# Patient Record
Sex: Female | Born: 1985 | Race: White | Hispanic: No | Marital: Married
Health system: Southern US, Community
[De-identification: ages and names within clinical notes are randomized; demographics above are authoritative.]

---
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
## 2011-06-25 ENCOUNTER — Encounter: Payer: Self-pay | Admitting: Obstetrics and Gynecology

## 2011-06-29 ENCOUNTER — Encounter: Payer: Self-pay | Admitting: Obstetrics and Gynecology

## 2011-07-27 ENCOUNTER — Encounter: Payer: Self-pay | Admitting: Obstetrics and Gynecology

## 2011-08-27 ENCOUNTER — Encounter: Payer: Self-pay | Admitting: Obstetrics and Gynecology

## 2011-09-14 ENCOUNTER — Inpatient Hospital Stay: Payer: Self-pay | Admitting: Student

## 2011-09-14 LAB — COMPREHENSIVE METABOLIC PANEL
Albumin: 3.5 g/dL (ref 3.4–5.0)
Alkaline Phosphatase: 387 U/L — ABNORMAL HIGH (ref 50–136)
BUN: 11 mg/dL (ref 7–18)
Chloride: 100 mmol/L (ref 98–107)
Co2: 26 mmol/L (ref 21–32)
Creatinine: 0.73 mg/dL (ref 0.60–1.30)
EGFR (African American): >60
EGFR (Non-African Amer.): >60
Potassium: 3.8 mmol/L (ref 3.5–5.1)
Sodium: 136 mmol/L (ref 136–145)

## 2011-09-14 LAB — CBC
HCT: 39.9 % (ref 35.0–47.0)
MCH: 29.4 pg (ref 26.0–34.0)
MCHC: 33.2 g/dL (ref 32.0–36.0)
MCV: 89 fL (ref 80–100)
Platelet: 257 10*3/uL (ref 150–440)
RBC: 4.51 10*6/uL (ref 3.80–5.20)
RDW: 12 % (ref 11.5–14.5)
WBC: 6.2 10*3/uL (ref 3.6–11.0)

## 2011-09-14 LAB — URINALYSIS, COMPLETE
Bacteria: NONE SEEN
Bilirubin,UR: NEGATIVE
Blood: NEGATIVE
Glucose,UR: NEGATIVE mg/dL (ref 0–75)
Nitrite: NEGATIVE
Ph: 7 (ref 4.5–8.0)
Protein: NEGATIVE
RBC,UR: <1 /HPF (ref 0–5)
Specific Gravity: 1.012 (ref 1.003–1.030)
Squamous Epithelial: 3
WBC UR: 1 /HPF (ref 0–5)

## 2011-09-14 LAB — LIPASE, BLOOD: Lipase: 75 U/L (ref 73–393)

## 2011-09-14 LAB — PREGNANCY, URINE: Pregnancy Test, Urine: NEGATIVE m[IU]/mL

## 2011-09-15 LAB — CBC WITH DIFFERENTIAL/PLATELET
Basophil #: 0 10*3/uL (ref 0.0–0.1)
Basophil %: 0.2 %
Eosinophil #: 0 10*3/uL (ref 0.0–0.7)
Eosinophil %: 0.2 %
HGB: 11.5 g/dL — ABNORMAL LOW (ref 12.0–16.0)
Lymphocyte #: 0.9 10*3/uL — ABNORMAL LOW (ref 1.0–3.6)
Lymphocyte %: 9.8 %
MCV: 89 fL (ref 80–100)
Neutrophil #: 7.8 10*3/uL — ABNORMAL HIGH (ref 1.4–6.5)
Neutrophil %: 84.2 %
Platelet: 274 10*3/uL (ref 150–440)
RBC: 3.87 10*6/uL (ref 3.80–5.20)
RDW: 12.4 % (ref 11.5–14.5)

## 2011-09-15 LAB — COMPREHENSIVE METABOLIC PANEL
Albumin: 3.1 g/dL — ABNORMAL LOW (ref 3.4–5.0)
Alkaline Phosphatase: 323 U/L — ABNORMAL HIGH (ref 50–136)
Anion Gap: 10 (ref 7–16)
BUN: 9 mg/dL (ref 7–18)
Calcium, Total: 8.7 mg/dL (ref 8.5–10.1)
Co2: 24 mmol/L (ref 21–32)
Creatinine: 0.61 mg/dL (ref 0.60–1.30)
EGFR (African American): >60
EGFR (Non-African Amer.): >60
Osmolality: 274 (ref 275–301)
Potassium: 4.1 mmol/L (ref 3.5–5.1)
SGOT(AST): 33 U/L (ref 15–37)
Sodium: 138 mmol/L (ref 136–145)
Total Protein: 7.1 g/dL (ref 6.4–8.2)

## 2011-09-16 LAB — CBC WITH DIFFERENTIAL/PLATELET
Basophil #: 0 10*3/uL (ref 0.0–0.1)
Basophil %: 0.4 %
Eosinophil #: 0.3 10*3/uL (ref 0.0–0.7)
HGB: 10 g/dL — ABNORMAL LOW (ref 12.0–16.0)
Lymphocyte %: 17.4 %
MCHC: 33 g/dL (ref 32.0–36.0)
MCV: 89 fL (ref 80–100)
Monocyte #: 1 x10 3/mm — ABNORMAL HIGH (ref 0.2–0.9)
Neutrophil #: 5.8 10*3/uL (ref 1.4–6.5)
Neutrophil %: 67.2 %
Platelet: 238 10*3/uL (ref 150–440)
RBC: 3.43 10*6/uL — ABNORMAL LOW (ref 3.80–5.20)
WBC: 8.6 10*3/uL (ref 3.6–11.0)

## 2011-09-16 LAB — HEPATIC FUNCTION PANEL A (ARMC)
Albumin: 2.4 g/dL — ABNORMAL LOW (ref 3.4–5.0)
Alkaline Phosphatase: 218 U/L — ABNORMAL HIGH (ref 50–136)
Bilirubin, Direct: 0.9 mg/dL — ABNORMAL HIGH (ref 0.00–0.20)
Bilirubin,Total: 1.4 mg/dL — ABNORMAL HIGH (ref 0.2–1.0)
SGOT(AST): 23 U/L (ref 15–37)
SGPT (ALT): 79 U/L — ABNORMAL HIGH
Total Protein: 5.8 g/dL — ABNORMAL LOW (ref 6.4–8.2)

## 2011-09-16 LAB — LIPASE, BLOOD: Lipase: 107 U/L (ref 73–393)

## 2011-10-02 ENCOUNTER — Emergency Department: Payer: Self-pay | Admitting: Emergency Medicine

## 2011-10-02 LAB — URINALYSIS, COMPLETE
Bilirubin,UR: NEGATIVE
Blood: NEGATIVE
Glucose,UR: NEGATIVE mg/dL (ref 0–75)
Ketone: NEGATIVE
Nitrite: NEGATIVE
Ph: 6 (ref 4.5–8.0)
Protein: NEGATIVE
RBC,UR: 1 /HPF (ref 0–5)
Specific Gravity: 1.004 (ref 1.003–1.030)
Squamous Epithelial: 3
WBC UR: 1 /HPF (ref 0–5)

## 2011-10-02 LAB — COMPREHENSIVE METABOLIC PANEL
Albumin: 4 g/dL (ref 3.4–5.0)
Alkaline Phosphatase: 128 U/L (ref 50–136)
BUN: 7 mg/dL (ref 7–18)
Bilirubin,Total: 0.8 mg/dL (ref 0.2–1.0)
Calcium, Total: 9 mg/dL (ref 8.5–10.1)
Co2: 27 mmol/L (ref 21–32)
Creatinine: 0.6 mg/dL (ref 0.60–1.30)
Osmolality: 277 (ref 275–301)
Potassium: 3.7 mmol/L (ref 3.5–5.1)
SGPT (ALT): 21 U/L
Sodium: 140 mmol/L (ref 136–145)

## 2011-10-02 LAB — CBC
HGB: 11.7 g/dL — ABNORMAL LOW (ref 12.0–16.0)
MCH: 29.7 pg (ref 26.0–34.0)
MCV: 88 fL (ref 80–100)
Platelet: 266 10*3/uL (ref 150–440)
RBC: 3.93 10*6/uL (ref 3.80–5.20)
RDW: 12.5 % (ref 11.5–14.5)

## 2011-10-02 LAB — AMYLASE: Amylase: 50 U/L (ref 25–115)

## 2011-10-03 ENCOUNTER — Inpatient Hospital Stay: Payer: Self-pay | Admitting: Specialist

## 2011-10-03 LAB — COMPREHENSIVE METABOLIC PANEL
Albumin: 3.9 g/dL (ref 3.4–5.0)
Alkaline Phosphatase: 129 U/L (ref 50–136)
Anion Gap: 6 — ABNORMAL LOW (ref 7–16)
BUN: 6 mg/dL — ABNORMAL LOW (ref 7–18)
Calcium, Total: 8.4 mg/dL — ABNORMAL LOW (ref 8.5–10.1)
Chloride: 106 mmol/L (ref 98–107)
Co2: 26 mmol/L (ref 21–32)
EGFR (African American): >60
Glucose: 86 mg/dL (ref 65–99)
Potassium: 3.7 mmol/L (ref 3.5–5.1)
SGOT(AST): 18 U/L (ref 15–37)
SGPT (ALT): 23 U/L
Sodium: 138 mmol/L (ref 136–145)

## 2011-10-03 LAB — CBC
MCH: 29.8 pg (ref 26.0–34.0)
MCV: 88 fL (ref 80–100)
Platelet: 259 10*3/uL (ref 150–440)
RBC: 3.96 10*6/uL (ref 3.80–5.20)
WBC: 5.1 10*3/uL (ref 3.6–11.0)

## 2011-10-03 LAB — LIPASE, BLOOD: Lipase: 83 U/L (ref 73–393)

## 2011-10-04 LAB — BASIC METABOLIC PANEL
Anion Gap: 6 — ABNORMAL LOW (ref 7–16)
BUN: 3 mg/dL — ABNORMAL LOW (ref 7–18)
Calcium, Total: 7.9 mg/dL — ABNORMAL LOW (ref 8.5–10.1)
Co2: 29 mmol/L (ref 21–32)
EGFR (African American): >60
EGFR (Non-African Amer.): >60
Osmolality: 278 (ref 275–301)

## 2011-10-04 LAB — HEPATIC FUNCTION PANEL A (ARMC)
Albumin: 3.2 g/dL — ABNORMAL LOW (ref 3.4–5.0)
Bilirubin, Direct: 0.3 mg/dL — ABNORMAL HIGH (ref 0.00–0.20)
Bilirubin,Total: 0.7 mg/dL (ref 0.2–1.0)
SGOT(AST): 19 U/L (ref 15–37)

## 2011-10-04 LAB — CBC WITH DIFFERENTIAL/PLATELET
Basophil #: 0 10*3/uL (ref 0.0–0.1)
Eosinophil %: 8.8 %
HCT: 32.9 % — ABNORMAL LOW (ref 35.0–47.0)
Lymphocyte %: 35.3 %
MCH: 29.8 pg (ref 26.0–34.0)
MCHC: 33.7 g/dL (ref 32.0–36.0)
Monocyte #: 0.4 x10 3/mm (ref 0.2–0.9)
Monocyte %: 9.2 %
Neutrophil %: 46.2 %
RBC: 3.72 10*6/uL — ABNORMAL LOW (ref 3.80–5.20)
WBC: 4.7 10*3/uL (ref 3.6–11.0)

## 2011-10-06 LAB — COMPREHENSIVE METABOLIC PANEL
Albumin: 3.2 g/dL — ABNORMAL LOW (ref 3.4–5.0)
Anion Gap: 6 — ABNORMAL LOW (ref 7–16)
BUN: <1 mg/dL — ABNORMAL LOW (ref 7–18)
Bilirubin,Total: 0.7 mg/dL (ref 0.2–1.0)
Calcium, Total: 8.1 mg/dL — ABNORMAL LOW (ref 8.5–10.1)
Co2: 30 mmol/L (ref 21–32)
EGFR (Non-African Amer.): >60
Glucose: 94 mg/dL (ref 65–99)
Total Protein: 6.2 g/dL — ABNORMAL LOW (ref 6.4–8.2)

## 2011-10-06 LAB — CBC WITH DIFFERENTIAL/PLATELET
Basophil %: 0.3 %
Eosinophil #: 0.2 10*3/uL (ref 0.0–0.7)
HCT: 32.2 % — ABNORMAL LOW (ref 35.0–47.0)
HGB: 11 g/dL — ABNORMAL LOW (ref 12.0–16.0)
Lymphocyte #: 1 10*3/uL (ref 1.0–3.6)
Lymphocyte %: 22.5 %
MCHC: 34.2 g/dL (ref 32.0–36.0)
MCV: 88 fL (ref 80–100)
Monocyte %: 9.3 %
Neutrophil #: 2.8 10*3/uL (ref 1.4–6.5)
Neutrophil %: 64.2 %
RDW: 12.3 % (ref 11.5–14.5)
WBC: 4.4 10*3/uL (ref 3.6–11.0)

## 2011-10-20 ENCOUNTER — Ambulatory Visit: Payer: Self-pay | Admitting: Family Medicine

## 2011-10-20 LAB — COMPREHENSIVE METABOLIC PANEL
Albumin: 4.1 g/dL (ref 3.4–5.0)
Alkaline Phosphatase: 128 U/L (ref 50–136)
Anion Gap: 6 — ABNORMAL LOW (ref 7–16)
BUN: 10 mg/dL (ref 7–18)
Calcium, Total: 8.7 mg/dL (ref 8.5–10.1)
Chloride: 104 mmol/L (ref 98–107)
Co2: 32 mmol/L (ref 21–32)
Creatinine: 0.8 mg/dL (ref 0.60–1.30)
EGFR (African American): >60
Glucose: 72 mg/dL (ref 65–99)
Osmolality: 281 (ref 275–301)
Potassium: 3.4 mmol/L — ABNORMAL LOW (ref 3.5–5.1)
SGOT(AST): 36 U/L (ref 15–37)
SGPT (ALT): 39 U/L

## 2011-10-20 LAB — URINALYSIS, COMPLETE
Blood: NEGATIVE
Glucose,UR: NEGATIVE mg/dL (ref 0–75)
Ketone: NEGATIVE
Leukocyte Esterase: NEGATIVE
Nitrite: NEGATIVE
Ph: 6 (ref 4.5–8.0)
Specific Gravity: >=1.03 (ref 1.003–1.030)
WBC UR: NONE SEEN /HPF (ref 0–5)

## 2011-10-20 LAB — CBC WITH DIFFERENTIAL/PLATELET
Basophil #: 0 10*3/uL (ref 0.0–0.1)
Basophil %: 0.5 %
Eosinophil #: 0.2 10*3/uL (ref 0.0–0.7)
Eosinophil %: 3 %
HCT: 36.6 % (ref 35.0–47.0)
HGB: 12.2 g/dL (ref 12.0–16.0)
Lymphocyte #: 1.9 10*3/uL (ref 1.0–3.6)
Lymphocyte %: 32.3 %
Monocyte #: 0.2 x10 3/mm (ref 0.2–0.9)
Monocyte %: 3.3 %
Neutrophil #: 3.6 10*3/uL (ref 1.4–6.5)
Platelet: 237 10*3/uL (ref 150–440)
RBC: 4.15 10*6/uL (ref 3.80–5.20)
WBC: 5.8 10*3/uL (ref 3.6–11.0)

## 2011-10-20 LAB — PREGNANCY, URINE: Pregnancy Test, Urine: NEGATIVE m[IU]/mL

## 2011-10-21 LAB — URINE CULTURE

## 2012-12-23 IMAGING — US ABDOMEN ULTRASOUND
1 series · 17 of 25 positions shown · non-contrast
Comparison: none

REASON FOR EXAM: persistent abdominal pain, please also reassess fluid
collection in gallbladder
COMMENTS:

PROCEDURE:     US  - US ABDOMEN GENERAL SURVEY  - October 05, 2011 [DATE]
RESULT:     Comparison: 10/03/2011
TECHNIQUE: Multiple grayscale and color Doppler images were obtained of the
abdomen.

[Series 1: abdomen ultrasound · 17 of 90 slices shown]
[im 1/90]
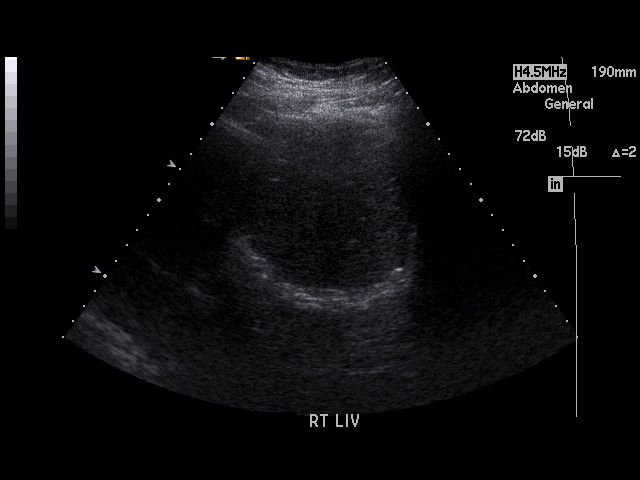
[im 8/90]
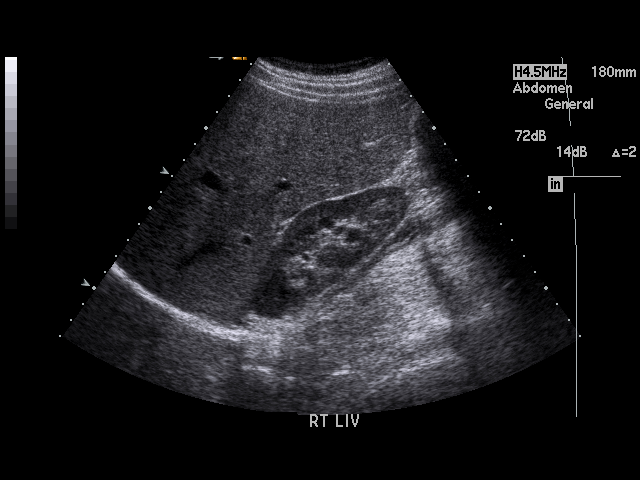
[im 12/90]
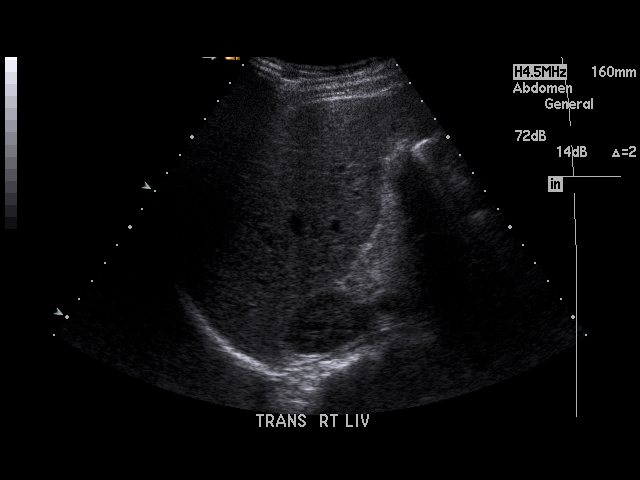
[im 19/90]
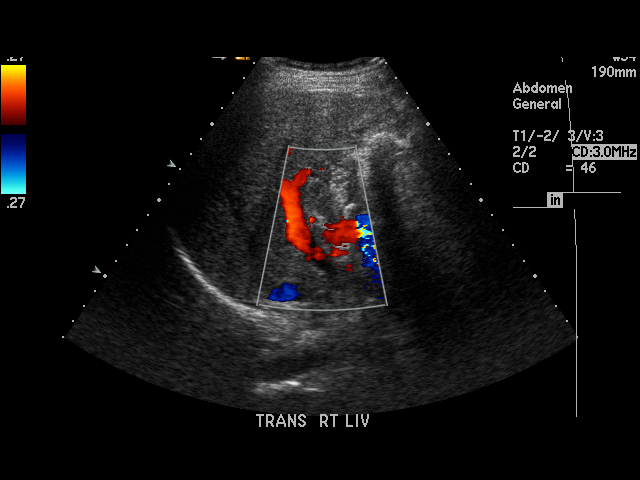
[im 23/90]
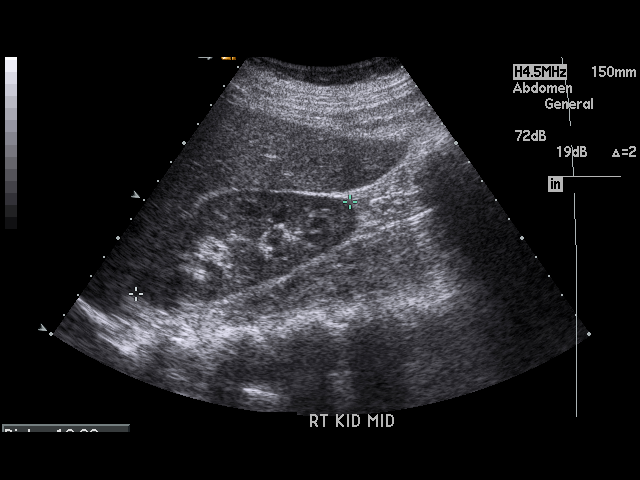
[im 30/90]
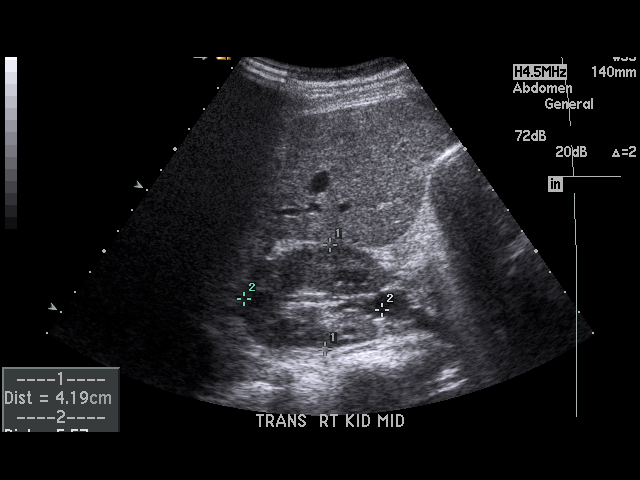
[im 34/90]
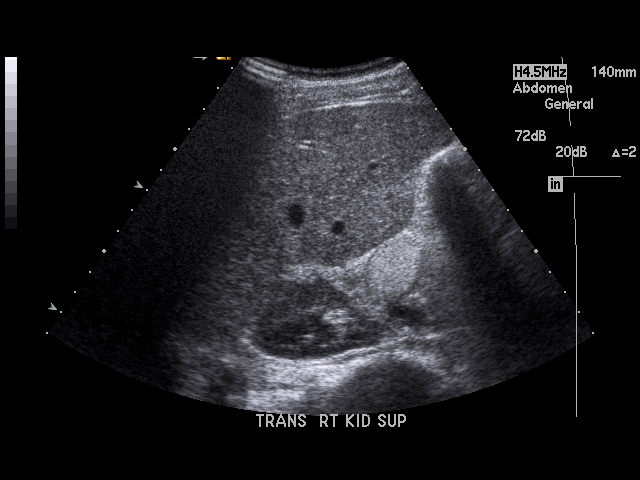
[im 41/90]
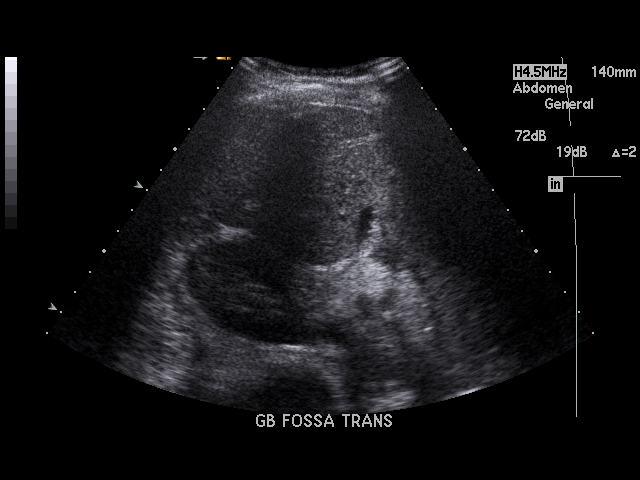
[im 45/90]
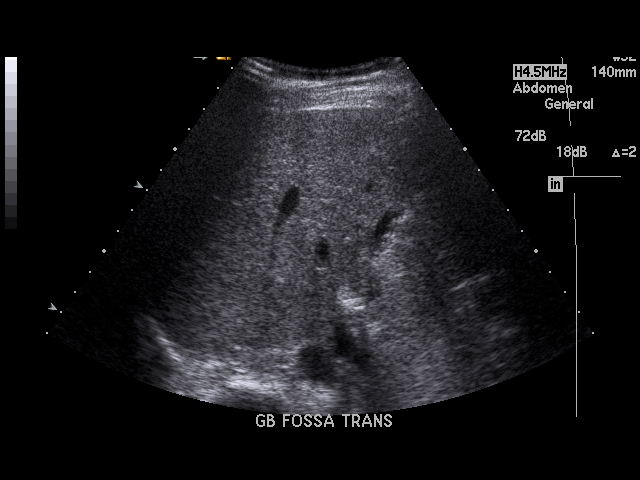
[im 49/90]
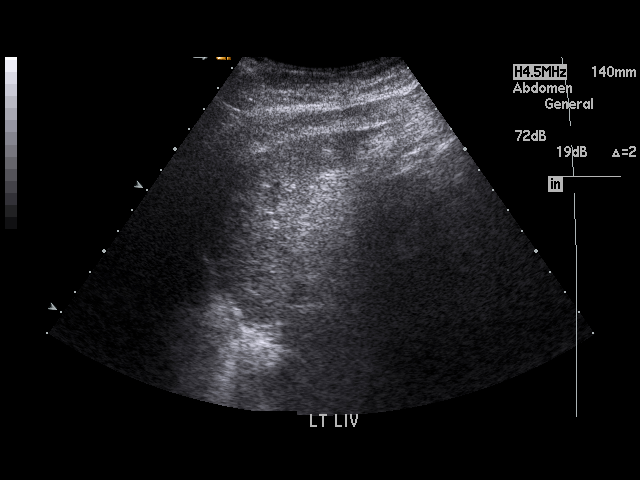
[im 56/90]
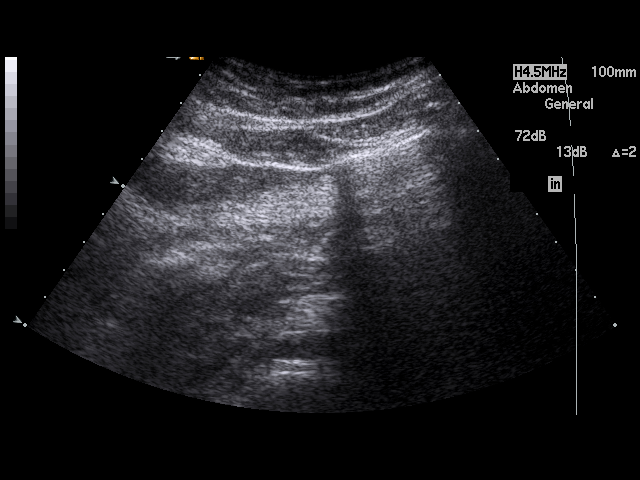
[im 60/90]
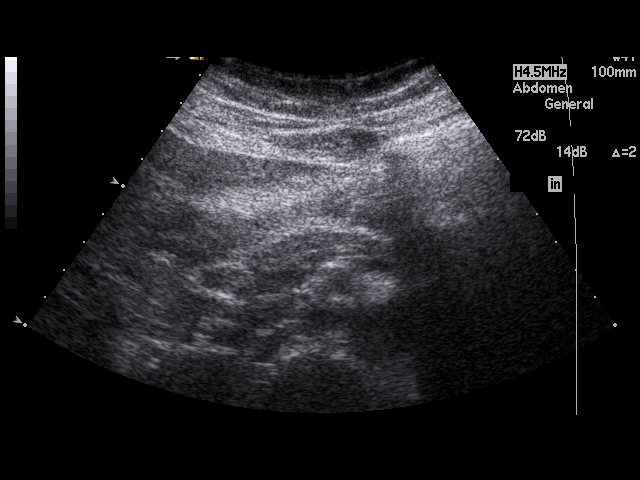
[im 67/90]
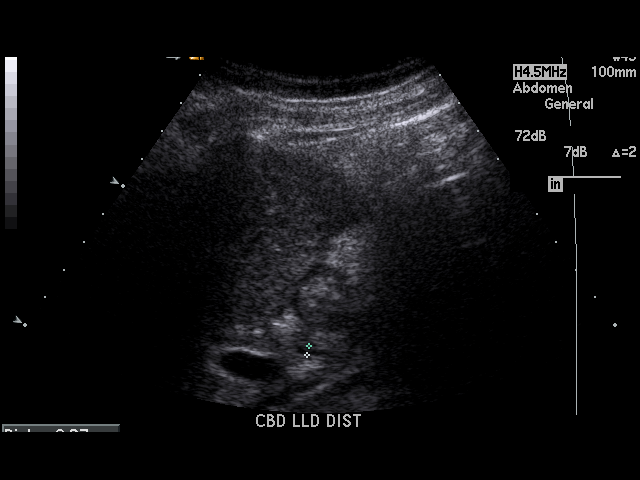
[im 71/90]
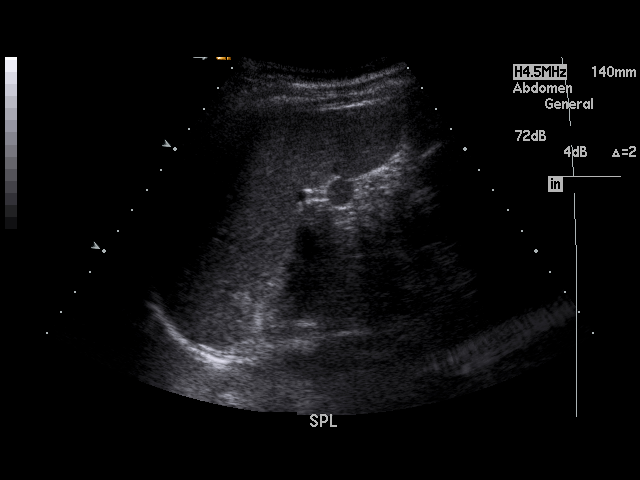
[im 78/90]
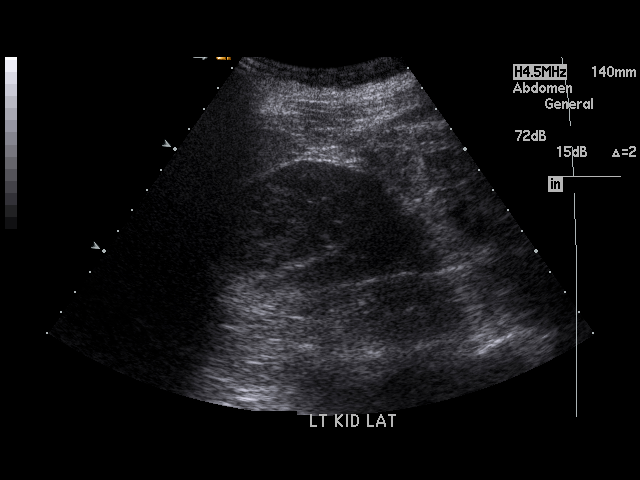
[im 82/90]
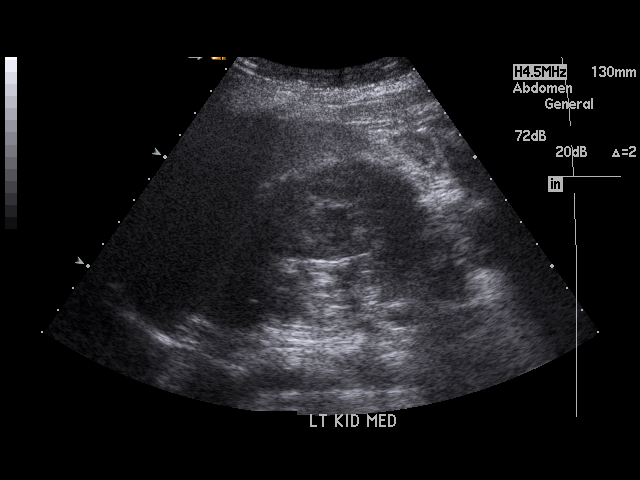
[im 90/90]
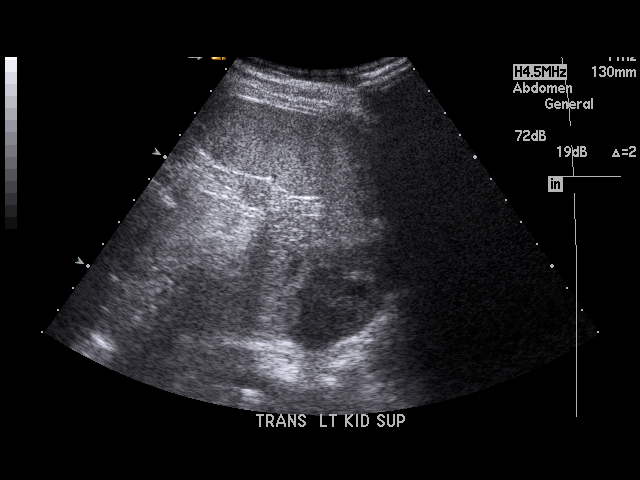

[17 of 25 positions shown; findings below may reference images not displayed]

FINDINGS: The visualized liver, pancreas, and spleen are unremarkable. There is a
small hypoechoic collection in the region of the gallbladder fossa which
measures 2.4 x 0.9 x 0.7 cm. Previously this measured 2.6 x 1.1 x 1.3 cm.
The patient is status post cholecystectomy. The common bile duct measures 3
mm. Small hypoechoic round focus adjacent to the spleen is consistent with a
splenule.

Images of the kidneys showed no hydronephrosis.
IMPRESSION: 1. Small hypoechoic collection in the gallbladder fossa is similar, to
slightly decreased in size from prior. This may be related to postoperative
changes. A biloma or infection are not excluded.
2. The common bile duct is normal in caliber.

## 2014-09-19 NOTE — Consult Note (Signed)
ERCP showed bile leak near cystic duct stump. Biliary sphincterotomy done. Balloon sweep revealed no stones. 7 Fr x 5 cm biliary stent placed. Clear liquid diet. Pain should improve quickly. Will need repeat ERCP in 1 month to make sure bile leak sealed and to remove stent. Dr. Mechele CollinElliott will check on patient tomorrow. Thanks.  Electronic Signatures: Lutricia Feilh, Kizzie Cotten (MD)  (Signed on 19-Apr-13 13:48)  Authored  Last Updated: 19-Apr-13 13:48 by Lutricia Feilh, Irving Bloor (MD)

## 2014-09-19 NOTE — Consult Note (Signed)
PATIENT NAMCourtney Thomas:  Thomas, Karla MR#:  [id number] DATE OF BIRTH:  03/22/1986  DATE OF CONSULTATION:  10/05/2011  REFERRING PHYSICIAN:   CONSULTING PHYSICIAN:  Adah Salvageichard E. Excell Seltzerooper, MD  CHIEF COMPLAINT: Right upper quadrant pain.   HISTORY OF PRESENT ILLNESS: This is a patient with a complicated history. She had a [facility] several weeks ago, had ongoing pain, stayed in the he hospital at that time for several days and was discharged. Came to [facility] and had findings suggestive of a biliary leak where an ERCP was performed and a stent was placed with papillotomy. Her pain resolved and then over the last few days her pain has increased again. She was admitted to the hospital for pain control and an ERCP performed yesterday by Dr. Bluford Kaufmannh showed no sign of a leak with complete healing and resolution of the leak and the stent was removed. Her liver function tests are normal. Her lipase is normal and an ultrasound shows a 2 cm fluid collection.   PAST MEDICAL HISTORY: Chronic pelvic inflammatory disease, unclear etiology. She is on multiple pain medications for this including Neurontin and apparently sees a pain specialist.   PAST SURGICAL HISTORY:  1. Left salpingectomy. 2. Cesarean section.  3. Laparoscopic cholecystectomy. 4. Two ERCPs.   ALLERGIES: None.   MEDICATIONS: Multiple, see chart.   FAMILY HISTORY: Noncontributory.   SOCIAL HISTORY: Patient was working a week ago prior to the onset of this recent pain.   REVIEW OF SYSTEMS: 10 system review was attempted but the patient is very somnolent and her boyfriend/significant other answers most of the questions. Otherwise negative with the exception of that mentioned in the history of present illness.   PHYSICAL EXAMINATION:  GENERAL: Somnolent-appearing patient, again her significant other answers most of the questions as she is very sleepy or does not care to answer the  questions.   VITAL SIGNS: She is afebrile, heart rate 104, respirations 18, blood pressure 117/86, 100% room air sat.   HEENT: No scleral icterus.   NECK: No palpable neck nodes.   ABDOMEN: Soft, nondistended. Scars are well healed. Her abdomen is essentially nontender. No rebound. No percussion tenderness. No guarding.   INTEGUMENT: No jaundice.   EXTREMITIES: Calves are nontender.   LABORATORY, DIAGNOSTIC AND RADIOLOGICAL DATA: Laboratory values are within normal limits with the exception of a mild anemia and normal white blood cell count. LFTs are normal.   An ultrasound shows a small fluid collection documented on two separate ultrasounds one day apart, however, the collection itself was very small.   ERCP results were reviewed as are the records.   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 There is no evidence of it being a open at this point. She had an ERCP yesterday that failed to identify a leak. The stent has been removed. She has a very small fluid collection which is too small to drain and I suspect that her pain is related to postoperative inflammatory process following biliary fistula which is now closed. My recommendation would be for either inpatient or outpatient pain control management and follow up with an ultrasound at some point by her primary care physician or our office as I see no surgical indications at this point. ____________________________ Adah Salvageichard E. Excell Seltzerooper, MD rec:cms D: 10/05/2011 20:39:28 ET T: 10/06/2011 14:51:37 ET  JOB#: [id number] Lattie HawICHARD E Mickelle Goupil MD ELECTRONICALLY SIGNED 10/06/2011 19:23

## 2014-09-19 NOTE — Consult Note (Signed)
Pt seen and examined. S/P choly for symptomatic gallstones. Admitted for recurrent abd pain and LFT elevation. T[facility]. [facility] due to frustration over lack of medical care. U/S show fluid at GGB fossa. DDx either retained CBD stone vs biloma. ERCP discussed in detail. Discussed potential risks, incl pancreatitis,etc.  If biloma, sphincterotomy with biliary stenting. If CBD stone, biliary sphincterotomy with stone extraction. Pt readily agreed. Plan this afternoon. Thanks  Electronic Signatures: Lutricia Feilh, Chrisangel Eskenazi (MD)  (Signed on 19-Apr-13 12:44)  Authored  Last Updated: 19-Apr-13 12:44 by Lutricia Feilh, Joella Saefong (MD)

## 2014-09-19 NOTE — Consult Note (Signed)
ERCP showed stent in place. This was removed. Cholangiogram showed complete healing of bile leak. No stone or sludge in CBD. No need for restenting. No DU or GU seen anywhere. Reg diet ordered. Hopefully, discharge soon. Will sign off. Thanks.  Electronic Signatures: Lutricia Feilh, Delonta Yohannes (MD)  (Signed on 09-May-13 12:35)  Authored  Last Updated: 09-May-13 12:35 by Lutricia Feilh, Tericka Devincenzi (MD)

## 2014-09-19 NOTE — Consult Note (Signed)
Chief Complaint:   Subjective/Chief Complaint Admitted for abdominal pain s/p cholecystecomy performed at [facility] on 08/07/2011 and diagnosed at the time presentation to ER 09/14/2011 with biliary stent. She had an ERCP performed by Dr. Verdie Shire yesterday for trhe indication of biliary leak.  Spinctectomy as well as CBD stent was placed.  Pain became severe yesterday evening. Dr. Vira Agar was notified and Morphine Sulfate PCA pump was ordered.  She states the PCA pump has helped in controlling the abdominal pain.  Complaint of nausea.  She had an order for Zofran 4 mg every 8 hours IV and just received Phenergan 25 mg IM.  No vomiting.  No bowel movement today but passing gas.  Patient was on MiraLax once daily prior to admission s/p surgery.   VITAL SIGNS/ANCILLARY NOTES: **Vital Signs.:   20-Apr-13 06:09   Vital Signs Type Routine   Temperature Temperature (F) 98.2   Celsius 36.7   Temperature Source oral   Pulse Pulse 92   Pulse source per Dinamap   Respirations Respirations 18   Systolic BP Systolic BP 440   Diastolic BP (mmHg) Diastolic BP (mmHg) 72   Mean BP 87   BP Source Dinamap   Pulse Ox % Pulse Ox % 99   Pulse Ox Activity Level  At rest   Oxygen Delivery Room Air/ 21 %   Brief Assessment:   Cardiac Regular  no murmur    Respiratory clear BS  no use of accessory muscles  Decreased respiratory effort due to pain.    Gastrointestinal details normal Slightly distended.  Generalized abdominal pain more localized proximal to umbilicus as well as epigastric.    Additional Physical Exam Alert and oriented. Appears in moderate discomfort.  Husband at bedside.  Color pink.  Skin warm and dry.     Routine Hem:  20-Apr-13 06:21    WBC (CBC) 9.3   RBC (CBC) 3.87   Hemoglobin (CBC) 11.5   Hematocrit (CBC) 34.2   Platelet Count (CBC) 274   MCV 89   MCH 29.8   MCHC 33.6   RDW 12.4   Neutrophil % 84.2   Lymphocyte % 9.8   Monocyte % 5.6   Eosinophil % 0.2   Basophil % 0.2    Neutrophil # 7.8   Lymphocyte # 0.9   Monocyte # 0.5   Eosinophil # 0.0   Basophil # 0.0  Routine Chem:  20-Apr-13 06:21    Glucose, Serum 88   BUN 9   Creatinine (comp) 0.61   Sodium, Serum 138   Potassium, Serum 4.1   Chloride, Serum 104   CO2, Serum 24   Calcium (Total), Serum 8.7  Hepatic:  20-Apr-13 06:21    Bilirubin, Total 1.5   Alkaline Phosphatase 323   SGPT (ALT) 128   SGOT (AST) 33   Total Protein, Serum 7.1   Albumin, Serum 3.1  Routine Chem:  20-Apr-13 06:21    Osmolality (calc) 274   eGFR (African American) >60   eGFR (Non-African American) >60   Anion Gap 10   Assessment/Plan:  Assessment/Plan:   Assessment Admitted for biliary leak.  S/p cholecystectomy performed at [facility] 09/07/2011.  ERCP performed by Dr. Verdie Shire yesterday with sphinct and stent placement.  Excerbation of abdominal pain over night with severe nausea.  No vomiting.  Afebrile.  Concern whether pain is consistent with bile leak vs pancreatitis s/p ERCP.    Plan 1.  Will continue Morphine PCA pump. 2.  Current anti-emetic therapy as  prescribed.  Currently orders for Zofran as well as Phenergan. 3.  Encouraged patient to try to take clear liquid diet.   4.  Dulcolax supp ordered to prevent constipation and her being at increased risk for ileus.  5.  Flat plate of abdomen ordered for evaluation of constipation.  6.  Lipase, Liver panel and CBC ordered for tomorrow am. 7.  Discussed patient's presentation with Dr. Vira Agar.   Electronic Signatures: Payton Emerald (NP)  (Signed 20-Apr-13 12:21)  Authored: Chief Complaint, VITAL SIGNS/ANCILLARY NOTES, Brief Assessment, Lab Results, Assessment/Plan   Last Updated: 20-Apr-13 12:21 by Payton Emerald (NP)

## 2014-09-19 NOTE — H&P (Signed)
PATIENT NAMEDOLOREZ, [name] MR#:  [id number] DATE OF BIRTH:  [birth year]  DATE OF ADMISSION:  10/03/2011  REFERRING PHYSICIAN: Dr. Carollee Massed    CHIEF COMPLAINT: Abdominal pain, vomiting.   HISTORY OF PRESENT ILLNESS: [name] is a 29 year old female who recently had laparoscopic cholecystectomy at [facility] in mid-April where she presented to [facility] 09/14/2011 with complaint of abdominal pain and transaminitis where the patient underwent ERCP by Dr. Bluford Kaufmann where she had sphincterectomy and stenting done. The patient did not have any complaints or problems since then but she started to develop sharp right upper quadrant pain starting yesterday. She came to the ED yesterday basically where she had LFTs done and her blood work was within normal limits so she was discharged home but the patient came again today with worsening vomiting and worsening pain. Hospitalist service was requested to admit the patient for pain management. The patient had a slight elevation of her total bilirubin from 0.8 to 1.1. She remains afebrile. No leukocytosis. 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   PAST SURGICAL HISTORY:  1. Recent laparoscopic cholecystectomy done at [facility].  2. Sphincterectomy with common bile duct biliary stent done.   FAMILY HISTORY: Negative for gallbladder problems per patient.   PERSONAL HISTORY: No history of smoking. Occasional alcohol intake. Works as a Lawyer.   HOME MEDICATIONS:  1. Acetaminophen/oxycodone 5/325 two tablets every six hours.  2. Carafate 1 gram oral 4 times a day.  3. Celexa 40 mg once daily.  4. Colace 100 mg 2 times a day.  5. Ibuprofen 800 mg every eight hours as needed for pain.  6. Klonopin 1 mg oral 2 times a day.  7. Neurontin  300 mg 1 capsule 3 times a day.  8. Nortriptyline 50 mg oral daily.  9. Nucynta 50 mg every four hours as needed for pain.  10. Oxycodone 5 mg every four hours as needed.  11. Protonix 40 mg oral 2 times a day.   ALLERGIES: No known drug allergies.   REVIEW OF SYSTEMS: GENERAL: Denies any fever, fatigue, weakness. EYES: Denies any blurry vision, double vision, pain. ENT: Denies any tinnitus, ear pain, hearing loss. RESPIRATORY: Denies any cough, wheezing, hemoptysis, dyspnea. CARDIOVASCULAR: Denies any chest pain, orthopnea, edema, palpitations. GI: Complains of nausea, vomiting. Denies diarrhea, constipation. Has complaints of right upper quadrant abdominal pain. Denies any hematemesis, melena. GU: Denies any dysuria, hematuria, renal colic. ENDOCRINE: Denies any polyuria, polydipsia, heat or cold intolerance. NEUROLOGIC: Denies any numbness, epilepsy, tremors. PSYCH: Denies any substance or alcohol abuse.   PHYSICAL EXAMINATION:    VITAL SIGNS: Temperature 97.6, pulse 97, respiratory rate 16, blood pressure 124/77, saturating 92% on room air.   GENERAL: Young female who is comfortable in no apparent distress.   HEENT: Head atraumatic, normocephalic. Pupils equal, reactive to light. Pink conjunctivae. Anicteric sclerae. Moist oral mucosa.   NECK: Supple. No thyromegaly. No JVD.   CHEST: Good air entry bilaterally. No wheezing, rales, or rhonchi.   CARDIOVASCULAR: S1, S2 heard. No rubs, murmur, gallops.   ABDOMEN: Mild tenderness in the right upper quadrant. Murphy sign is negative. No rebound. No guarding. Bowel sounds present.   EXTREMITIES: No edema. No clubbing. No cyanosis.  PERTINENT LABS: Glucose 86, BUN 6, creatinine 0.7, sodium 138, potassium 3.7, chloride 106, CO2 26, total bilirubin 1.1, alkaline phosphatase 129, AST 18, ALT 23, white blood cell 5.1, hemoglobin 11.8, hematocrit 35, platelets 259. Urinalysis negative.   Ultrasound showing common bile duct measuring 7 mm.  Stent noted at the common bile duct partially visualized.   ASSESSMENT AND PLAN: Recent history of laparoscopic cholecystectomy with biliary leak status post sphincterectomy and biliary stent. The patient has mild elevation of total bilirubin. Will admit the patient for observation until seen and evaluated by Dr. Bluford Kaufmannh. Fredricka BonineUnlikely there is evidence of any stent obstruction as there is no evidence of common bile duct dilatation and regarding the fluid in the gallbladder fossa this is most likely secondary to recent surgery versus the biliary leak. Will consult Dr. Bluford Kaufmannh. Meanwhile, the patient will be kept n.p.o. There is no indication at this point for antibiotics.   CODE STATUS: The patient is FULL CODE.   TOTAL TIME SPENT ON PATIENT CARE: 40 minutes.   ____________________________ Starleen Armsawood S. Myeisha Kruser, MD dse:drc D: 10/03/2011 06:27:36 ET T: 10/03/2011 09:32:49 ET JOB#: [id number]  cc: Starleen Armsawood S. Velinda Wrobel, MD, <Dictator> Masiya Claassen Teena IraniS Hatsumi Steinhart MD ELECTRONICALLY SIGNED 10/04/2011 0:10

## 2014-09-19 NOTE — H&P (Signed)
PATIENT NAMEARTINA, [name] MR#:  [id number] DATE OF BIRTH:  1985-12-13  DATE OF ADMISSION:  09/14/2011  REASON FOR HOSPITAL VISIT: Abdominal pain.   HISTORY OF PRESENT ILLNESS: This is a 29 year old Caucasian female with significant past medical history of:  1. 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  2. Right upper quadrant pain requiring laparoscopic cholecystectomy at [facility] one week ago, patient post laparoscopic cholecystectomy had recurrence of abdominal pain with elevation of total bilirubin requiring readmission to [facility] was discharged two days ago after pain got better and her total bilirubin which peaked at 3.2 started to decrease.   29 year old female with above dictated history who recently had laparoscopic cholecystectomy at [facility] one week ago for bouts of cholecystitis due to gallstones, after laparoscopic cholecystectomy patient was discharged home, however, she presented again to [facility] with recurrence of abdominal pain, she was found to have elevated total bilirubin after laparoscopic cholecystectomy, total bilirubin peaked at 3.2, she had a noncontrast M.R.C.P. which did not reveal any ductal dilation, however, the M.R.C.P. report says there was some fluid around liver which due to noncontrast cannot be clearly explained (they could not rule out bile leak postop), however, patient's abdominal pain improved and her total bilirubin started to decline thereafter according to husband she was discharged, however, there is question whether she signed out AGAINST MEDICAL ADVICE. Patient now presents with recurrence of abdominal pain starting suddenly last evening while she was sleeping, she explains this pain as constant, sharp, more in the left upper quadrant area this time with some radiation to left shoulder, she has mild nausea with it, no aggravating or relieving factors, denies any fever, chills, denies any diarrhea, in the ER case was discussed with Dr. Bluford Kaufmann, GI, and  Dr. Excell Seltzer, surgery, Dr. Bluford Kaufmann, GI, suggests that he would do ERCP later today therefore patient will be admitted by medical service, Dr. Excell Seltzer will follow the patient.    REVIEW OF SYSTEMS: At the time of my interview patient denies any headache, fever, chills. Denies any new problems with vision or hearing. Denies any problems swallowing food or liquids. No chest pain, palpitations, cough, phlegm or shortness of breath. Abdominal pain as above. No diarrhea. No blood in stool or urine. No dysuria. She denies any possibility of being pregnant. No weakness, tingling, numbness in any extremity. No skin rashes or bruises. No recent weight gain or weight loss.   Full 10 point review of systems was obtained, except as dictated above all of the review of systems are negative.   PAST MEDICAL AND SURGICAL: Above.   FAMILY HISTORY: Negative for gallbladder problems per patient.   PERSONAL HISTORY: No history of smoking. Occasional alcohol intake.   HOME MEDICATIONS: Home medication record is not complete. As per the discharge paper orders from [facility] patient was discharged home on:  1. Neurontin 600 mg p.o. t.i.d.  2. OxyContin 20 mg p.o. t.i.d.  3. Motrin 800 mg p.o. t.i.d.  4. Celexa 40 mg p.o. once a day. 5. Klonopin 1 mg p.o. b.i.d.  6. Nortriptyline 50 mg once a day. 7. Nucynta 50 mg q.6 hours p.r.n.  8. Zofran 4 mg p.o. q.6. Patient was given these medications per pain consultation done at [facility].   ALLERGIES: No history of drug allergies.    PHYSICAL EXAMINATION: VITAL SIGNS: Temperature 96.1, pulse 92, respirations 19, blood pressure 143/81, 99% on room air.   GENERAL: Young Caucasian female lying in hospital bed, no apparent  discomfort.   HEENT: Normocephalic, atraumatic head. Pupils equal, reactive to light. Pink and moist tongue and throat. Faint scleral icterus.   NECK: Supple neck. No JVD.   PSYCH: Insight is intact. Not suicidal, homicidal.   CENTRAL NERVOUS SYSTEM:  All cranial nerves intact. No focal neurological deficits.   CHEST WALL: Movement bilaterally symmetrical. Good air movement bilaterally. No rhonchi no wheezes.   CARDIOVASCULAR;  Regular rate, rhythm. Normal S1, S2. No gallops or murmurs.   ABDOMEN: Soft, positive bowel sounds. Some tenderness in the left upper quadrant area. No rebound, guarding, rigidity. Laparoscopic cholecystectomy scars are healing well.   EXTREMITIES: No cyanosis, clubbing, edema.   MUSCULOSKELETAL: Joints no effusion. Good range of motion in all major joints and all four extremities.   LYMPH: No palpable cervical lymph nodes.   LABORATORY, DIAGNOSTIC AND RADIOLOGICAL DATA: White count 6.2, hemoglobin 13.3, hematocrit 39.9, platelets 257, sodium 136, potassium 3.8, chloride 100, bicarbonate 26, BUN 11, creatinine 0.7, glucose 125, AST 57, ALT 199, alkaline phosphatase 387, total bilirubin 1.9.   ASSESSMENT AND PLAN:  1. Abdominal pain with elevated transaminases in a patient who recently had laparoscopic cholecystectomy. Suspicious for retained or passed gallstone or sludge in the CBD. Plan has been discussed by Dr. Margarita GrizzleWoodruff in the ER with Dr. Excell Seltzerooper, general surgeon, and Dr. Bluford Kaufmannh, GI. Dr. Bluford Kaufmannh has suggested that he would do an ERCP later today, patient will be kept n.p.o., pain control with IV medications as she is n.p.o. Of note, patient might need pain consultation input.  2. Chronic pelvic pain due to pelvic inflammatory disease. Outpatient monitoring.  3. Patient will get SCDs for deep vein thrombosis prophylaxis. IV PPI for peptic ulcer disease prophylaxis.  4. CODE STATUS: Patient is FULL CODE.    ____________________________ Stanford ScotlandPrashant K. Thedore MinsSingh, MD pks:cms D: 09/14/2011 06:37:02 ET T: 09/14/2011 07:18:17 ET JOB#: [id number]  cc: Bess HarvestPrashant K. Thedore MinsSingh, MD, <Dictator> Stanford ScotlandPRASHANT K [facility] MD ELECTRONICALLY SIGNED 09/14/2011 8:34

## 2014-09-19 NOTE — Discharge Summary (Signed)
PATIENT NAMCourtney Thomas:  Culpepper, Obera MR#:  [id number] DATE OF BIRTH:  1985/11/08  DATE OF ADMISSION:  09/14/2011 DATE OF DISCHARGE:  09/16/2011  The patient walked out AGAINST MEDICAL ADVICE on 09/16/2011.   CONSULTANTS: Dr. Mechele CollinElliott and Dr. Bluford Kaufmannh from GI   DIAGNOSES AT THE TIME OF AGAINST MEDICAL ADVICE:  1. Abdominal pain with elevated transaminitis in the setting of biliary leak post cholecystectomy with procedures done being sphincterotomy and stenting. 2. Chronic pelvic pain.   DISPOSITION: The patient walked out AGAINST MEDICAL ADVICE.   HOSPITAL COURSE: For full details of the history and physical, please see the dictation done on 09/14/2011 by Dr. Thedore MinsSingh. Briefly, this is a 29 year old Caucasian female with history of chronic pelvic pain and also [facility] to presentation who presented to our hospital for recurrence of abdominal pain. While in the ER, Dr. Bluford Kaufmannh had planned on doing an ERCP and the patient was admitted to the hospitalist service. Per history and physical, she had been found to have elevated total bilirubin post laparoscopic cholecystectomy and the patient had a noncontrast MRCP which did not show any ductal dilation. On arrival the bilirubin was 1.9, alkaline phosphatase was 387 with elevation of AST and ALT as well. The patient had no leukocytosis. The patient was seen by GI, Dr. Bluford Kaufmannh, and then followed Dr. Mechele CollinElliott later on. The patient underwent an ERCP which showed a biliary leak. The patient underwent biliary sphincterotomy and a temporary stent was placed on 09/14/2011. Furthermore, repeat ERCP in one month was recommended to make sure biliary leak had been sealed and also for the removal of the biliary stent. The patient initially was started on IV opioids but in the setting of complaints of increased pain was transitioned to a PCA pump. Her diet was advanced per GI. However, the patient walked out AGAINST MEDICAL ADVICE without signing any  paperwork apparently on April 21st.   TOTAL TIME SPENT: 30 minutes.     CODE STATUS: The patient was FULL CODE.   ____________________________ Krystal EatonShayiq Billi Bright, MD sa:drc D: 09/23/2011 19:24:52 ET T: 09/24/2011 13:24:58 ET JOB#: [id number]  cc: Krystal EatonShayiq Sussan Meter, MD, <Dictator> Krystal EatonSHAYIQ Taksh Hjort MD ELECTRONICALLY SIGNED 10/02/2011 12:07

## 2014-09-19 NOTE — Consult Note (Signed)
Brief Consult Note: Diagnosis: Known history of s/p laproscopic cholecystectomy.  Admission 09/14/2011 for severe abdominal pain s/p lap cholecystecomy with the finding of bile leak.  s/p ERCP 09/14/2011 with stent being placed CBD.  Admitted for recurrent abdominal pain mostly localized to RUQ with finding of mild elevation of total bilirubin and 2.6 x 1.1 cm fluid collection noted at the gallbladder fossa with common bile duct measuring 7 mm.  Consult note dictated.   Recommend to proceed with surgery or procedure.   Discussed with Attending MD.   Comments: Patient's presentation was discussed with Dr. Lutricia FeilPaul Oh.  Will proceed with ERCP tomorrow given findings of abdominal ultrasound and reasessment of CBD.  Order given to nursing staff during code orange with computer system.  Order verbally given for Levaquin 250 mg IVPB on call.  Clear liquid diet today and NPO after midnight.  Will continue to monitor laboratory results as well pain.  Electronic Signatures: Rodman KeyHarrison, Dawn S (NP)  (Signed [phone]-May-13 17:40)  Authored: Brief Consult Note   Last Updated: 08-May-13 17:40 by Rodman KeyHarrison, Dawn S (NP)

## 2014-09-19 NOTE — Consult Note (Signed)
Pt in pain and requesting something for this.  Also constipated and pain in left mid upper abd.  ASking for Miralax.  Will offer dulcolax supp tonight and make sure she can keep things down before doing the Miralax.  As far as pain management I talked to Dr. Pernell DupreAdams and will start a PCA. of morphine.  Electronic Signatures: Scot JunElliott, Robert T (MD)  (Signed on 19-Apr-13 19:58)  Authored  Last Updated: 19-Apr-13 19:58 by Scot JunElliott, Robert T (MD)

## 2014-09-19 NOTE — Consult Note (Signed)
Brief Consult Note: Diagnosis: postop cholecystectomy pain.   Patient was seen by consultant.   Consult note dictated.   Recommend further assessment or treatment.   Comments: with pt's Hx, I suspect she has experienced an intermittent bile leak. Stent has been removed. very small fluid collection present in GB fossa. No surgical plans, suspect pain will resolve as leak has closed. will follow.  Electronic Signatures: Lattie Hawooper, Richard E (MD)  (Signed 10-May-13 20:16)  Authored: Brief Consult Note   Last Updated: 10-May-13 20:16 by Lattie Hawooper, Richard E (MD)

## 2014-09-19 NOTE — Consult Note (Signed)
Pt seen and examined. See Dawn Harrison's notes. Known hx of biloma. S/P biliary sphincterotomy and stent placement. Acute RUQ pain with N/V. U/S suggesting reaccumulation of bile. Plan to repeat ERCP tomorrow afternoon to see if bile leak still present. If yes, place new stent tomorrow. Thanks.  Electronic Signatures: Lutricia Feilh, Steffan Caniglia (MD)  (Signed on 08-May-13 18:04)  Authored  Last Updated: 08-May-13 18:04 by Lutricia Feilh, Shawnique Mariotti (MD)

## 2014-09-19 NOTE — Consult Note (Signed)
Pt doing better.  Took Miralax and had a small bowel movement last night.  She walked some last evening.  Complains of discomfort in right shoulder when she moves arm and feels a crunching like sound or feeling.  No trauma.  Overall feels better.  PCA working well.  TB down to 1.4, SGPT to 79, lipase 107, VSS afebrile.  May go home tomorrow if cleared by Dr. Bluford KaufmannH.  Will advance diet to full liquids.  Continue Miralax.  Electronic Signatures: Scot JunElliott, Sowmya Partridge T (MD)  (Signed on 21-Apr-13 09:26)  Authored  Last Updated: 21-Apr-13 09:26 by Scot JunElliott, Aloha Bartok T (MD)

## 2014-09-19 NOTE — Consult Note (Signed)
PATIENT NAMEERCELL, [name]  MR#:  [id number] DATE OF BIRTH:  07-02-1985  DATE OF CONSULTATION:  09/14/2011  CONSULTING PHYSICIAN: Ezzard Standing. Dung Prien, MD   REASON FOR REFERRAL: Abdominal pain, abnormal liver enzymes.   DESCRIPTION: The patient is a 29 year old white female with a known history of gallstones who underwent a laparoscopic cholecystectomy at [facility] one week ago. She was discharged to home after a few days but then had to be seen in [place] on Saturday because of recurrent abdominal pain and jaundice. She was then sent home and then came back to the ER again on Sunday and finally got admitted because of abdominal pain. At that time, bilirubin peaked at 3.2. She had a noncontrast MRCP which was supposedly negative except for some fluid around the liver. ERCP was discussed but never rescheduled. Finally, the  patient left this hospital AGAINST MEDICAL ADVICE because of frustration of lack of medical care.    Last night she woke up with severe pain again in the upper abdomen and decided to come to our Emergency Room for evaluation. Liver enzymes are still abnormal, less than what it was at [facility], but decision was made to admit the patient for surgical and GI consultation.    The patient is still in a fair amount of pain. The pain is mostly in the epigastric region as well as left upper quadrant. There is some pain in the right upper quadrant area as well. There is no fever or chills. There is no diarrhea or gross hematochezia or melena.   PAST MEDICAL HISTORY: Pelvic inflammatory disease.   FAMILY HISTORY: Negative for any gallbladder issues.   SOCIAL HISTORY: There is no smoking. Occasional alcohol.   REVIEW OF SYSTEMS: There is no change from the initial review of systems done this morning by the hospitalist.   HOME MEDICATIONS:  Home medications include Neurontin, Motrin, Celexa, Klonopin, nortriptyline, Nucynta, Zofran p.r.n. and OxyContin.   ALLERGIES: No known drug allergies.     PHYSICAL EXAMINATION:  GENERAL: The patient is afebrile. Vital signs are stable.   HEENT: Head and neck examination is normocephalic, atraumatic head. Pupils are equally reactive. Throat is clear.   NECK: Supple.   CARDIAC: Examination revealed regular rhythm and rate without murmurs.   LUNGS: Clear bilaterally.   ABDOMEN: Normoactive bowel sounds, soft. There is tenderness more in the epigastric bowels in the left upper quadrant and right upper quadrant area. There are some scars from laparoscopic cholecystectomy. The patient has active bowel sounds. There is no rebound or guarding.   EXTREMITIES: No clubbing, cyanosis, or edema.   NEUROLOGICAL: There are no focal deficits.   LABORATORY, DIAGNOSTIC AND RADIOLOGICAL DATA:  Sodium 136, potassium 3.8, chloride 100, CO2 26, BUN 11, creatinine 0.7, glucose 125, total bilirubin 1.9, alkaline phosphatase 387, AST 57, ALT 199.  White count 6.2, hemoglobin 13.3.  Urinalysis is negative.  Pregnancy is negative.  Ultrasound shows a small amount of ascites. There is some fluid in the gallbladder fossa.   ASSESSMENT AND PLAN: This is a patient with a laparoscopic cholecystectomy. According to the patient, there is no mention of intraoperative cholangiogram that was done. Liver enzymes are still elevated, and she has a fair amount of abdominal pain. We discussed ERCP with the patient to evaluate for possible retained stone in the bile duct versus bile leak. I also discussed potential complications including pancreatitis, bleeding and reaction to medications. If there is a biloma, biliary sphincterotomy with stent placement will be performed. If  there is a stone, then sphincterotomy with stone extraction will be performed. The procedure will be scheduled this afternoon. The patient readily agreed.     Thank you for the referral.    ____________________________ Ezzard StandingPaul Y. Bluford Kaufmannh, MD pyo:cbb D: 09/14/2011 12:41:08 ET T: 09/14/2011 15:33:21  ET JOB#: [id number]  cc: Ezzard StandingPaul Y. Bluford Kaufmannh, MD, <Dictator> Wallace CullensPAUL Y Bryn Saline MD ELECTRONICALLY SIGNED 09/21/2011 9:37

## 2014-09-19 NOTE — Consult Note (Signed)
PATIENT NAME:  [name], [name] MR#:  [id number] DATE OF BIRTH:  Jun 13, 1985  DATE OF CONSULTATION:  10/03/2011  REFERRING PHYSICIAN:  Janalyn Harder, MD   CONSULTING PHYSICIAN:  Lutricia Feil, MD/Katheline Brendlinger Mort Sawyers, NP  REASON FOR CONSULTATION: Abdominal pain and vomiting.   HISTORY OF PRESENT ILLNESS: [name] is a 29 year old Caucasian female who is status post laparoscopic cholecystectomy at [facility] mid April and presented to [facility] on 09/14/2011 with the complaint of abdominal pain, elevation of transaminase levels. She was seen in consultation by myself as well as Dr. Lutricia Feil. She did have an ERCP done on 09/14/2011 with findings of bile leak. A 7-French x 5 cm temporary stent was placed into the common bile duct. Bile flowed through the stent. The stent was in good position. The patient was discharged from the hospital on the 21st, actually had left AGAINST MEDICAL ADVICE. The patient had been doing extremely well up until Monday evening when she started to experience pain that was actually more around midnight or early Tuesday morning. Pain was to the right upper quadrant of the abdomen. She doubled over with pain. No nausea, though she vomited yesterday evening. She was afebrile. Bowels move 3 to 4 times a day. She presented to the Emergency Room at [facility] yesterday evening. The patient was discharged home with Protonix and Carafate to take as directed. After being home a couple of hours, her pain just continued to worsen, and thus her husband brought her back to the Emergency Room. The patient states that the pain is severe though it is not to the severity which it was initially when she was admitted and seen a couple of weeks ago. There was noted a slight elevation in total bilirubin from 0.8 to 1.1. The patient did have an abdominal ultrasound done which showed a 2.6 x 1.6 cm fluid collection noted in the gallbladder fossa with the common bile duct measuring 7 mm and the stent being noted, the common bile duct as  well.   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   PAST SURGICAL HISTORY:  1. Laparoscopic cholecystectomy.  2. Sphincterotomy with common bile duct stent being placed 09/14/2011.   FAMILY HISTORY: Negative for gallbladder issues.   SOCIAL HISTORY: No tobacco. Occasional alcohol. Married, with children. Employed with [employer].  REVIEW OF SYSTEMS: All systems are reviewed and unremarkable other than as stated above.   PHYSICAL EXAMINATION:  VITAL SIGNS: Temperature 97.9 with a pulse of 98, respirations are 22, blood pressure is 127/92, with a pulse oximetry of 98% on room air.   GENERAL: A well-developed, well-nourished 29 year old Caucasian female who does appear in moderate discomfort. Shaky appearance during interviewing process. Husband has been present at bedside.   HEENT: Normocephalic, atraumatic. Pupils are equal, reactive to light. Conjunctivae are clear. Sclerae are anicteric.   NECK: Supple. Trachea midline. No lymphadenopathy or thyromegaly.   PULMONARY: Symmetric rise and fall of chest. Clear to auscultation throughout. No adventitious sounds.   CARDIOVASCULAR: Regular rate and rhythm, S1, S2. No murmurs, no gallops.   ABDOMEN: Soft, nondistended. Marked discomfort noted to right upper quadrant. Fortunately, pain does not seem to be, on physical examination at least, to the severity at which it was during her last hospitalization. No evidence of hepatosplenomegaly. Bowel sounds in four quadrants.   RECTAL: Deferred.   MUSCULOSKELETAL: Moving all four extremities. No contractures. No clubbing.   EXTREMITIES: No edema.   SKIN: Color pink. Warm and dry, no lesions,  no rashes.   NEUROLOGICAL: No gross neurological deficits.   PSYCH: Alert and oriented x4. Memory grossly intact.     LABORATORY, DIAGNOSTIC AND RADIOLOGICAL DATA:  Chemistry panel within normal limits on admission on May 7th. On May 8th, BUN decreased  to 6.0. Calcium declined from 9.0 to 8.4.  Hepatic panel within normal limits on May 7th with a bilirubin of 0.8 and rose today's date to 1.1.  CBC: Initially, hemoglobin was 11.7 with hematocrit of 34.6. White count was 4.1. On May 8th, WBC count is 5.1 with a hemoglobin of 11.8 and hematocrit of 35.0.  Urinalysis is within normal limits.  Urine pregnancy test is negative.  Three-way of abdomen inclusive of PA of chest: No definite bowel obstruction. Stent present. No acute cardiopulmonary findings. Abdominal ultrasound as stated under history of present illness.    IMPRESSION: Known history status post laparoscopic cholecystectomy, admission 09/14/2011 for severe abdominal pain, status post laparoscopic cholecystectomy with findings of bile leak. Status post ERCP on 09/14/2011 with stent being placed in common bile duct. Admitted for recurrent abdominal pain at this time mostly located to the right upper quadrant with findings of mid elevation in total bilirubin and a 2.6 x 1.1 cm fluid collection noted on gallbladder fossa with common bile duct measuring 7 mm.   PLAN: The patient's presentation was discussed with Dr. Lutricia FeilPaul Oh. Recommendation is to proceed with ERCP tomorrow to allow reassessment of common bile  to assess the patency of stent. Order placed, given to nursing staff during Code Orange with computer system. Verbally given as well was an order for Levaquin 250 mg IVPB on call. Clear liquid diet today and n.p.o. after midnight. We will continue to monitor the patient's laboratory results as well as pain.   Thank you for allowing us to participate in the care of [name].   These services provided by Rodman Keyawn S. Xiara Knisley, MS, APRN, [facility], FNP,  under collaborative agreement with Lutricia FeilPaul Oh, MD.   ____________________________ Rodman Keyawn S. Ozzie Knobel, NP dsh:cbb D: 10/03/2011 17:40:39 ET T: 10/04/2011 10:19:59 ET JOB#: [id number]  cc: Rodman Keyawn S. Latresha Yahr, NP, <Dictator> Rodman KeyAWN S Nilda Keathley MD ELECTRONICALLY  SIGNED 10/11/2011 12:25

## 2014-09-19 NOTE — Discharge Summary (Signed)
PATIENT NAMELORIEANN, [name] MR#:  [id number] DATE OF BIRTH:  23-Feb-1986  DATE OF ADMISSION:  10/03/2011 DATE OF DISCHARGE:  10/06/2011  For a detailed note, please take a look at the history and physical done by Dr. Huey Bienenstock on admission.   DIAGNOSES AT DISCHARGE:  1. Abdominal pain, epigastric and right upper quadrant in nature.  2. Status post recent laparoscopic cholecystectomy.  3. Small biloma.  4. History of anxiety and depression.  5. Chronic pain seeking behavior.   DIET: The patient is being discharged on a regular diet.   ACTIVITY: As tolerated.   FOLLOW-UP: Follow-up with [facility] and with [facility].   DISCHARGE MEDICATIONS:  1. Tylenol with oxycodone 2 tabs q.6h. as needed.  2. Carafate 1 gram q.i.d.  3. Protonix 40 mg twice a day.  4. Neurontin 300 mg t.i.d.  5. Amitriptyline 50 mg daily. 6. Celexa 40 mg daily.  7. Nucynta 50 mg q.4-6h. as needed. 8. Klonopin 1 mg b.i.d.  9. Colace 100 mg b.i.d.  10. Oxycodone 5 mg q.4 hours p.r.n. pain. 11. Bentyl 10 mg t.i.d.  12. Zofran 4 mg every q.6 hours p.r.n. nausea and vomiting.   CONSULTANT DURING THE HOSPITAL COURSE:  1. Dr. Renae Fickle Oh from gastroenterology.  2. Dr. Dionne Milo from general surgery.   PERTINENT STUDIES: Abdomen 3-way done on 05/07 showing no definite bowel obstruction, biliary stents present, cholecystectomy clips present, no acute cardiopulmonary disease, no evidence of intraperitoneal free air. An ultrasound done on 05/13 with limited ultrasound showing prior cholecystectomy, small fluid collection in the gallbladder fossa which is nonspecific but increased in size from prior, could be concerning for a biloma. A stent present in the common bile duct. The common bile duct measures 7 mm. A repeat ultrasound done on 05/10 showing small hypoechoic collection in the gallbladder fossa similar to slightly decreased sized from prior. This may be related to postoperative changes, a biloma or  infection cannot be excluded. Common bile duct is normal in caliber.   HOSPITAL COURSE: This is a 29 year old female with medical problems as mentioned above who presented to the hospital on 10/02/2011 secondary to abdominal pain, nausea and vomiting.   1. Epigastric/right upper quadrant abdominal pain, nausea, and vomiting: The patient apparently had had a recent laparoscopic cholecystectomy done at [facility], therefore was admitted to the hospital for possible postoperative complications. She has been in the hospital for four days. She had been requiring very high dose narcotics. Her imaging studies only showed a small biloma. She was seen by Dr. Bluford Kaufmann from gastroenterology. He ended up performing a repeat ERCP on the patient which showed complete healing of the bile leak. The previously placed stent worked which was removed. The cholangiogram was essentially normal. Despite having the stent removed, the patient continued to complain of abdominal pain and requiring high dose narcotics. Her behavior was very consistent with a narcotic seeking behavior. I attempted to taper her medications, her morphine, and she was quite upset on having her morphine tapered. I recommended that I would not increase any of her pain medications and would not order any further testing. The patient then adamantly demanded that she be discharged home. I discussed with Dr. Michela Pitcher who was covering for Dr. Excell Seltzer and he did not think that the patient would benefit from getting a CT scan as it would not change our management. She likely just needs to go follow-up with her surgeon who operated on her at [facility]. The patient was  given a prescription for some Zofran and also some Bentyl. I did attempt to tell the patient that we can keep her in the hospital to see if her diet advances but I refuse to increase her pain medications or offer any further testing, at which point she did not want to stay in the hospital.   The patient has had a normal white  cell count and normal liver function with two fairly normal abdominal ultrasounds. Therefore, there is no urgent need for any surgical or GI intervention. She likely can follow up with her primary care doctor and her surgeon as an outpatient for further evaluation of her abdominal pain.  2. Anxiety/depression: The patient will continue her Celexa and her Klonopin and she was maintained on that.   3. Gastroesophageal reflux disease: The patient was maintained on her Protonix and Carafate. She will resume that upon discharge.   4. As mentioned, the patient does show a very high drug-seeking behavior as the only thing she wanted is for me to increase her pain medications as she was quite upset that I tapered them. I did recommend that I would keep her in the hospital as long as she would continue to eat a bit more and ambulate a bit more without escalation of her pain medications or any further testing, but the patient refused to do that and therefore she was discharged home.   TIME SPENT: Time spent with the discharge was 40 minutes.   ____________________________ Rolly PancakeVivek J. Cherlynn KaiserSainani, MD vjs:rbg D: 10/06/2011 15:20:30 ET T: 10/09/2011 09:38:22 ET JOB#: [id number]  cc: [facility]   [facility] SirenVIVEK J Zoelle Markus MD ELECTRONICALLY SIGNED 10/09/2011 16:44
